# Patient Record
Sex: Male | Born: 1990 | Race: White | Hispanic: No | Marital: Single | Smoking: Never smoker
Health system: Southern US, Community
[De-identification: ages and names within clinical notes are randomized; demographics above are authoritative.]

## PROBLEM LIST (undated history)

## (undated) HISTORY — PX: OTHER SURGICAL HISTORY: SHX169

---

## 2016-02-27 ENCOUNTER — Other Ambulatory Visit: Payer: Self-pay

## 2016-02-27 ENCOUNTER — Ambulatory Visit
Admission: EM | Admit: 2016-02-27 | Discharge: 2016-02-27 | Disposition: A | Discharge status: Home / Self Care | Payer: 59 | Attending: Internal Medicine | Admitting: Internal Medicine

## 2016-02-27 DIAGNOSIS — K2970 Gastritis, unspecified, without bleeding: Secondary | ICD-10-CM | POA: Diagnosis not present

## 2016-02-27 DIAGNOSIS — R55 Syncope and collapse: Secondary | ICD-10-CM | POA: Insufficient documentation

## 2016-02-27 DIAGNOSIS — R109 Unspecified abdominal pain: Secondary | ICD-10-CM | POA: Diagnosis present

## 2016-02-27 MED ORDER — OMEPRAZOLE 20 MG PO CPDR: 40.0000 mg | DELAYED_RELEASE_CAPSULE | Freq: Two times a day (BID) | ORAL | Status: DC

## 2016-02-27 MED ORDER — FAMOTIDINE 40 MG PO TABS
40.0000 mg | ORAL_TABLET | Freq: Once | ORAL | Status: AC
  Administered 2016-02-27: 40 mg via ORAL

## 2016-02-27 MED ORDER — ONDANSETRON 8 MG PO TBDP
8.0000 mg | ORAL_TABLET | Freq: Once | ORAL | Status: AC
  Administered 2016-02-27: 8 mg via ORAL

## 2016-02-27 NOTE — Discharge Instructions (Signed)
ECG was unremarkable today; vitals signs obtained lying/sitting/standing do not suggest dehydration. Prescription for omeprazole 40mg  twice daily for 2 weeks was sent to the [facility] in [place]. Famotidine 40 mg 1-2 times daily or ranitidine 300 mg 1-2 times daily may also be helpful.   All of these medications are available over the counter. Stay hydrated.  Limit alcohol use.   Recheck if having frequent difficulty with vomiting, especially if you pass out again.  Dr Schuyler AmorWilliam Plonk at [facility] and Dr Everlene OtherJayce Cook at [facility] are both taking new patients.   Gastritis, Adult Gastritis is soreness and puffiness (inflammation) of the lining of the stomach. If you do not get help, gastritis can cause bleeding and sores (ulcers) in the stomach. HOME CARE   Only take medicine as told by your doctor.  If you were given antibiotic medicines, take them as told. Finish the medicines even if you start to feel better.  Drink enough fluids to keep your pee (urine) clear or pale yellow.  Avoid foods and drinks that make your problems worse. Foods you may want to avoid include:  Caffeine or alcohol.  Chocolate.  Mint.  Garlic and onions.  Spicy foods.  Citrus fruits, including oranges, lemons, or limes.  Food containing tomatoes, including sauce, chili, salsa, and pizza.  Fried and fatty foods.  Eat small meals throughout the day instead of large meals. GET HELP RIGHT AWAY IF:   You have black or dark red poop (stools).  You throw up (vomit) blood. It may look like coffee grounds.  You cannot keep fluids down.  Your belly (abdominal) pain gets worse.  You have a fever.  You do not feel better after 1 week.  You have any other questions or concerns. MAKE SURE YOU:   Understand these instructions.  Will watch your condition.  Will get help right away if you are not doing well or get worse.   This information is not intended to  replace advice given to you by your health care provider. Make sure you discuss any questions you have with your health care provider.   Document Released: 02/12/2008 Document Revised: 11/18/2011 Document Reviewed: 10/09/2011 Elsevier Interactive Patient Education [employer]2016 Elsevier Inc.

## 2016-02-27 NOTE — ED Notes (Addendum)
Patient was at work and felt dizzy and lightheaded. He started vomiting and then he passed out. He does have a history of intestinal problems. He does take medications for this issue however he doesn't know the names of them. 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 He also mentions that his spinal column hurts near the tail bone all the time.

## 2016-02-27 NOTE — ED Provider Notes (Addendum)
CSN: [id number]     Arrival date & time 02/27/16  [id number] History   First MD Initiated Contact with Patient 02/27/16 0840     Chief Complaint  Patient presents with  . Abdominal Pain   HPI  Patient is a 25 year old gentleman with history of intermittent upper GI difficulties for several years. He is not currently taking any medication for reflux. He moved here about 90 days ago from [place], and has had previous evaluations in [place] and in [place]. He felt fine yesterday, over slept a little today got up and went to work. He didn't feel that well work, had some stomach upset, was throwing up and then passed out. No injury, bumped his knee on the way down, but was able to walk into the urgent care independently. No further emesis. Not having diarrhea or change in bowel habits. No urinary symptoms. No fever. No emotional upset, feels happy despite recent move.  past medical history. Reflux, not currently taking medication.  Also reports that he presented with [facility] to [facility] and was told after the evaluation that the bottom wall of his heart was not squeezing normally. History reviewed. No pertinent past surgical history. No history of abdominal or any surgeries History reviewed. family history. No family history of stomach disorders, Crohn's disease, inflammatory bowel disease   Social History  Substance Use Topics  . Smoking status: Never Smoker   . Smokeless tobacco: Never Used  . 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   Allergies  Review of patient's allergies indicates no known allergies.  Home Medications  None  Meds Ordered and Administered this Visit   Medications  ondansetron (ZOFRAN-ODT) disintegrating tablet 8 mg (8 mg Oral Given 02/27/16 0908)  famotidine (PEPCID) tablet 40 mg (40 mg Oral Given 02/27/16 0907)    BP 144/82  mmHg  Pulse 56  Temp(Src) 97.8 F (36.6 C) (Oral)  Resp 16  Ht 6' (1.829 m)  Wt 165 lb (74.844 kg)  BMI 22.37 kg/m2  SpO2 100% Orthostatic VS for the past 24 hrs:  BP- Lying Pulse- Lying BP- Sitting Pulse- Sitting BP- Standing at 0 minutes Pulse- Standing at 0 minutes  02/27/16 0848 127/72 mmHg (!) 47 133/88 mmHg 52 129/81 mmHg 61    Physical Exam  Constitutional: He is oriented to person, place, and time. No distress.  Alert, nicely groomed  HENT:  Head: Atraumatic.  Eyes:  Conjugate gaze, no eye redness/drainage  Neck: Neck supple.  Cardiovascular: Normal rate and regular rhythm.   Pulmonary/Chest: No respiratory distress.  Lungs clear, symmetric breath sounds  Abdominal: Soft. He exhibits no distension. There is no tenderness. There is no rebound and no guarding.  Musculoskeletal: Normal range of motion. He exhibits no edema.  Neurological: He is alert and oriented to person, place, and time.  Skin: Skin is warm and dry. No rash noted. No pallor.  No cyanosis  Nursing note and vitals reviewed.   ED Course  Procedures (including critical care time)  ECG Done in the urgent care and reviewed by this provider demonstrates sinus bradycardia at a heart rate of 57 bpm; no acute ST or T-wave changes. PR interval 160 ms, QRS duration 82 ms, QTC 395 ms. QRS axis 76.  MDM   1. Gastritis   2. Vasovagal syncope  Meds ordered this encounter  Medications  . omeprazole (PRILOSEC) 20 MG capsule    Sig: Take 2 capsules (40 mg total) by mouth 2 (two) times daily before a meal.    Dispense:  60 capsule    Refill:  0   ECG was unremarkable today; vitals signs obtained lying/sitting/standing do not suggest dehydration. Prescription for omeprazole  twice daily for 2 weeks was sent to the [facility] in [place]. Famotidine 40 mg 1-2 times daily or ranitidine 300 mg 1-2 times daily may also be helpful.   All of these medications are available over the counter. Stay hydrated.  Limit  alcohol use.   Recheck if having frequent difficulty with vomiting, especially if you pass out again.  Dr Schuyler Amor at [facility] and Dr Everlene Other at [facility] are both taking new patients.     Eustace Moore, MD 02/27/16 1610  Eustace Moore, MD 02/27/16 [phone]

## 2016-02-29 ENCOUNTER — Ambulatory Visit
Admission: EM | Admit: 2016-02-29 | Discharge: 2016-02-29 | Disposition: A | Discharge status: Home / Self Care | Payer: 59 | Attending: Family Medicine | Admitting: Family Medicine

## 2016-02-29 ENCOUNTER — Encounter: Payer: Self-pay | Admitting: *Deleted

## 2016-02-29 DIAGNOSIS — S39012A Strain of muscle, fascia and tendon of lower back, initial encounter: Secondary | ICD-10-CM

## 2016-02-29 MED ORDER — TIZANIDINE HCL 6 MG PO CAPS: 6.0000 mg | ORAL_CAPSULE | Freq: Three times a day (TID) | ORAL | Status: DC

## 2016-02-29 MED ORDER — ONDANSETRON 8 MG PO TBDP: 8.0000 mg | ORAL_TABLET | Freq: Two times a day (BID) | ORAL | Status: DC

## 2016-02-29 NOTE — ED Notes (Signed)
Seen here on 6/20, same symptoms worse today. States nausea with any food, also dizziness, and low back pain.

## 2016-02-29 NOTE — Discharge Instructions (Signed)
Back Injury Prevention °Back injuries can be very painful. They can also be difficult to heal. After having one back injury, you are more likely to injure your back again. It is important to learn how to avoid injuring or re-injuring your back. The following tips can help you to prevent a back injury. °WHAT SHOULD I KNOW ABOUT PHYSICAL FITNESS? °· Exercise for 30 minutes per day on most days of the week or as directed by your health care provider. Make sure to: °· Do aerobic exercises, such as walking, jogging, biking, or swimming. °· Do exercises that increase balance and strength, such as tai chi and yoga. These can decrease your risk of falling and injuring your back. °· Do stretching exercises to help with flexibility. °· Try to develop strong abdominal muscles. Your abdominal muscles provide a lot of the support that is needed by your back. °· Maintain a healthy weight.  This helps to decrease your risk of a back injury. °WHAT SHOULD I KNOW ABOUT MY DIET? °· Talk with your health care provider about your overall diet. Take supplements and vitamins only as directed by your health care provider. °· Talk with your health care provider about how much calcium and vitamin D you need each day. These nutrients help to prevent weakening of the bones (osteoporosis). Osteoporosis can cause broken (fractured) bones, which lead to back pain. °· Include good sources of calcium in your diet, such as dairy products, green leafy vegetables, and products that have had calcium added to them (fortified). °· Include good sources of vitamin D in your diet, such as milk and foods that are fortified with vitamin D. °WHAT SHOULD I KNOW ABOUT MY POSTURE? °· Sit up straight and stand up straight. Avoid leaning forward when you sit or hunching over when you stand. °· Choose chairs that have good low-back (lumbar) support. °· If you work at a desk, sit close to it so you do not need to lean over. Keep your chin tucked in. Keep your neck  drawn back, and keep your elbows bent at a right angle. Your arms should look like the letter "L." °· Sit high and close to the steering wheel when you drive. Add a lumbar support to your car seat, if needed. °· Avoid sitting or standing in one position for very long. Take breaks to get up, stretch, and walk around at least one time every hour. Take breaks every hour if you are driving for long periods of time. °· Sleep on your side with your knees slightly bent, or sleep on your back with a pillow under your knees. Do not lie on the front of your body to sleep. °WHAT SHOULD I KNOW ABOUT LIFTING, TWISTING, AND REACHING? °Lifting and Heavy Lifting °· Avoid heavy lifting, especially repetitive heavy lifting. If you must do heavy lifting: °· Stretch before lifting. °· Work slowly. °· Rest between lifts. °· Use a tool such as a cart or a dolly to move objects if one is available. °· Make several small trips instead of carrying one heavy load. °· Ask for help when you need it, especially when moving big objects. °· Follow these steps when lifting: °· Stand with your feet shoulder-width apart. °· Get as close to the object as you can. Do not try to pick up a heavy object that is far from your body. °· Use handles or lifting straps if they are available. °· Bend at your knees. Squat down, but keep your heels off the floor. °·   Keep your shoulders pulled back, your chin tucked in, and your back straight. °· Lift the object slowly while you tighten the muscles in your legs, abdomen, and buttocks. Keep the object as close to the center of your body as possible. °· Follow these steps when putting down a heavy load: °· Stand with your feet shoulder-width apart. °· Lower the object slowly while you tighten the muscles in your legs, abdomen, and buttocks. Keep the object as close to the center of your body as possible. °· Keep your shoulders pulled back, your chin tucked in, and your back straight. °· Bend at your knees. Squat  down, but keep your heels off the floor. °· Use handles or lifting straps if they are available. °Twisting and Reaching °· Avoid lifting heavy objects above your waist. °· Do not twist at your waist while you are lifting or carrying a load. If you need to turn, move your feet. °· Do not bend over without bending at your knees. °· Avoid reaching over your head, across a table, or for an object on a high surface. °WHAT ARE SOME OTHER TIPS? °· Avoid wet floors and icy ground. Keep sidewalks clear of ice to prevent falls. °· Do not sleep on a mattress that is too soft or too hard. °· Keep items that are used frequently within easy reach. °· Put heavier objects on shelves at waist level, and put lighter objects on lower or higher shelves. °· Find ways to decrease your stress, such as exercise, massage, or relaxation techniques. Stress can build up in your muscles. Tense muscles are more vulnerable to injury. °· Talk with your health care provider if you feel anxious or depressed. These conditions can make back pain worse. °· Wear flat heel shoes with cushioned soles. °· Avoid sudden movements. °· Use both shoulder straps when carrying a backpack. °· Do not use any tobacco products, including cigarettes, chewing tobacco, or electronic cigarettes. If you need help quitting, ask your health care provider. °  °This information is not intended to replace advice given to you by your health care provider. Make sure you discuss any questions you have with your health care provider. °  °Document Released: 10/03/2004 Document Revised: 01/10/2015 Document Reviewed: 08/30/2014 °Elsevier Interactive Patient Education ©2016 Elsevier Inc. ° °Lumbosacral Strain °Lumbosacral strain is a strain of any of the parts that make up your lumbosacral vertebrae. Your lumbosacral vertebrae are the bones that make up the lower third of your backbone. Your lumbosacral vertebrae are held together by muscles and tough, fibrous tissue (ligaments).    °CAUSES  °A sudden blow to your back can cause lumbosacral strain. Also, anything that causes an excessive stretch of the muscles in the low back can cause this strain. This is typically seen when people exert themselves strenuously, fall, lift heavy objects, bend, or crouch repeatedly. °RISK FACTORS °· Physically demanding work. °· Participation in pushing or pulling sports or sports that require a sudden twist of the back (tennis, golf, baseball). °· Weight lifting. °· Excessive lower back curvature. °· Forward-tilted pelvis. °· Weak back or abdominal muscles or both. °· Tight hamstrings. °SIGNS AND SYMPTOMS  °Lumbosacral strain may cause pain in the area of your injury or pain that moves (radiates) down your leg.  °DIAGNOSIS °Your health care provider can often diagnose lumbosacral strain through a physical exam. In some cases, you may need tests such as X-ray exams.  °TREATMENT  °Treatment for your lower back injury depends on many factors that   your clinician will have to evaluate. However, most treatment will include the use of anti-inflammatory medicines. °HOME CARE INSTRUCTIONS  °· Avoid hard physical activities (tennis, racquetball, waterskiing) if you are not in proper physical condition for it. This may aggravate or create problems. °· If you have a back problem, avoid sports requiring sudden body movements. Swimming and walking are generally safer activities. °· Maintain good posture. °· Maintain a healthy weight. °· For acute conditions, you may put ice on the injured area. °¨ Put ice in a plastic bag. °¨ Place a towel between your skin and the bag. °¨ Leave the ice on for 20 minutes, 2-3 times a day. °· When the low back starts healing, stretching and strengthening exercises may be recommended. °SEEK MEDICAL CARE IF: °· Your back pain is getting worse. °· You experience severe back pain not relieved with medicines. °SEEK IMMEDIATE MEDICAL CARE IF:  °· You have numbness, tingling, weakness, or problems  with the use of your arms or legs. °· There is a change in bowel or bladder control. °· You have increasing pain in any area of the body, including your belly (abdomen). °· You notice shortness of breath, dizziness, or feel faint. °· You feel sick to your stomach (nauseous), are throwing up (vomiting), or become sweaty. °· You notice discoloration of your toes or legs, or your feet get very cold. °MAKE SURE YOU:  °· Understand these instructions. °· Will watch your condition. °· Will get help right away if you are not doing well or get worse. °  °This information is not intended to replace advice given to you by your health care provider. Make sure you discuss any questions you have with your health care provider. °  °Document Released: 06/05/2005 Document Revised: 09/16/2014 Document Reviewed: 04/14/2013 °Elsevier Interactive Patient Education ©2016 Elsevier Inc. ° °

## 2016-02-29 NOTE — ED Provider Notes (Signed)
CSN: [id number]     Arrival date & time 02/29/16  1041 History   First MD Initiated Contact with Patient 02/29/16 1132     Chief Complaint  Patient presents with  . Dizziness  . Nausea  . Emesis  . Back Pain   (Consider location/radiation/quality/duration/timing/severity/associated sxs/prior Treatment) HPI this is a [age]-year-old male who returns after being seen 2 days ago by Dr. Dayton ScrapeMurray diagnosed him with a gastritis and a vasovagal reaction. Prescribed omeprazole 40 mg twice a day which he states he has been taken but despite that has continued to have nausea when he eats any food dizziness which seems worse when he presented 2 days ago. In addition he states that he has been having low back pain is nonradiating, mostly on the left side just to the spine. This is going on for months according to the patient he noticed when he [place] [facility] [place]. He also has belly pain which is periumbilical which has been present for some time as well. Had no vomiting. He states that he is able to eat and drink his chest that he has the belly pain 15-30 minutes after eating. He has had no fever or chills. He denies diarrhea or constipation and had normal bowel movement this morning.    History reviewed. No pertinent past medical history. History reviewed. No pertinent past surgical history. History reviewed. No pertinent family history. Social History  Substance Use Topics  . Smoking status: Never Smoker   . Smokeless tobacco: Never Used  . Alcohol Use: 0.6 oz/week    1 Cans of beer per week    Review of Systems  Constitutional: Positive for activity change. Negative for fever, chills and fatigue.  Gastrointestinal: Positive for nausea and abdominal pain. Negative for vomiting, diarrhea and constipation.  All other systems reviewed and are negative.   Allergies  Review of patient's allergies indicates no known allergies.  Home Medications   Prior to Admission medications    Medication Sig Start Date End Date Taking? Authorizing Provider  omeprazole (PRILOSEC) 20 MG capsule Take 2 capsules (40 mg total) by mouth 2 (two) times daily before a meal. 02/27/16 03/12/16 Yes Eustace MooreLaura W Murray, MD  ondansetron (ZOFRAN ODT) 8 MG disintegrating tablet Take 1 tablet (8 mg total) by mouth 2 (two) times daily. 02/29/16   Lutricia FeilWilliam P Roemer, PA-C  tizanidine (ZANAFLEX) 6 MG capsule Take 1 capsule (6 mg total) by mouth 3 (three) times daily. 02/29/16   Lutricia FeilWilliam P Roemer, PA-C   Meds Ordered and Administered this Visit  Medications - No data to display  BP 113/63 mmHg  Pulse 77  Temp(Src) 98.3 F (36.8 C) (Oral)  Resp 16  Ht 6' (1.829 m)  Wt 165 lb (74.844 kg)  BMI 22.37 kg/m2  SpO2 100% No data found.   Physical Exam  Constitutional: He is oriented to person, place, and time. He appears well-developed and well-nourished. No distress.  HENT:  Head: Normocephalic and atraumatic.  Right Ear: External ear normal.  Left Ear: External ear normal.  Nose: Nose normal.  Mouth/Throat: Oropharynx is clear and moist. No oropharyngeal exudate.  Eyes: Conjunctivae are normal. Pupils are equal, round, and reactive to light.  Neck: Normal range of motion. Neck supple.  Pulmonary/Chest: Effort normal and breath sounds normal. No respiratory distress. He has no wheezes. He has no rales.  Abdominal: Soft. Bowel sounds are normal. He exhibits no distension and no mass. There is tenderness. There is no rebound and  no guarding.  There is tenderness to to palpation diffusely of the lower quadrants bilaterally. There is no rebound no guarding no masses palpable. The patient does state that with palpation of the abdomen he does feel the pain in his back.  Musculoskeletal: He exhibits tenderness.  Examination of his lumbar spine shows some palpable spasm in the lower segments more left than right. He does have some mild limitation of motion for flexion and lateral flexion to the left.   Lymphadenopathy:    He has no cervical adenopathy.  Neurological: He is alert and oriented to person, place, and time.  Skin: Skin is warm and dry. He is not diaphoretic.  Psychiatric: He has a normal mood and affect. His behavior is normal. Judgment and thought content normal.  Nursing note and vitals reviewed.   ED Course  Procedures (including critical care time)  Labs Review Labs Reviewed - No data to display  Imaging Review No results found.   Visual Acuity Review  Right Eye Distance:   Left Eye Distance:   Bilateral Distance:    Right Eye Near:   Left Eye Near:    Bilateral Near:         MDM   1. Lumbar strain, initial encounter    Discharge Medication List as of 02/29/2016 12:22 PM    START taking these medications   Details  ondansetron (ZOFRAN ODT) 8 MG disintegrating tablet Take 1 tablet (8 mg total) by mouth 2 (two) times daily., Starting 02/29/2016, Until Discontinued, Normal    tizanidine (ZANAFLEX) 6 MG capsule Take 1 capsule (6 mg total) by mouth 3 (three) times daily., Starting 02/29/2016, Until Discontinued, Normal      Plan: 1. Test/x-ray results and diagnosis reviewed with patient 2. rx as per orders; risks, benefits, potential side effects reviewed with patient 3. Recommend supportive treatment with Symptom avoidance and rest as necessary for his low back pain. I have recommended that he hydrate so that his urine is clear. I've reiterated with Dr. Dayton ScrapeMurray had told the patient about the necessity for follow-up with her primary care physician for further workup. I have given him the name of Dr. Adriana Simasook who perform a more thorough investigation and may even consider a GI consult. If he is not improving or is worsening he should go to emergency department we have encouraged him to continue taking the Prilosec as prescribed. I have cautioned him regarding use of the muscle relaxants with activities require concentration and judgment and not use them while  driving. 4. F/u prn if symptoms worsen or don't improve     Lutricia FeilWilliam P Roemer, PA-C 02/29/16 1325

## 2016-03-27 ENCOUNTER — Encounter: Payer: Self-pay | Admitting: Emergency Medicine

## 2016-03-27 ENCOUNTER — Ambulatory Visit
Admission: EM | Admit: 2016-03-27 | Discharge: 2016-03-27 | Disposition: A | Discharge status: Home / Self Care | Payer: 59 | Attending: Family Medicine | Admitting: Family Medicine

## 2016-03-27 DIAGNOSIS — R0789 Other chest pain: Secondary | ICD-10-CM | POA: Diagnosis not present

## 2016-03-27 DIAGNOSIS — R079 Chest pain, unspecified: Secondary | ICD-10-CM | POA: Diagnosis present

## 2016-03-27 NOTE — ED Provider Notes (Signed)
CSN: [id number]     Arrival date & time 03/27/16  1307 History   First MD Initiated Contact with Patient 03/27/16 1322     Chief Complaint  Patient presents with  . Chest Pain   (Consider location/radiation/quality/duration/timing/severity/associated sxs/prior Treatment) HPI: The patient presents today with history of tightness in his chest with possibly increased heart rate intermittently over the last 2 weeks. Patient states that earlier today he was about to eat when he started to have an episode where he felt his heart rate was up and had some tightness in his chest that then caused him to have left neck numbness going into his shoulder but not down his arm. He states that this lasted only a few minutes and then resolved he does not have these symptoms now. He states that his heart feels back to normal now. He denies any shortness of breath, nausea, vomiting, headache, dizziness, fever, weakness. He denies any caffeine use today. He does not drink caffeine often. He denies any alcohol use recently, no smoking or illicit drug use like cocaine or heroin ever. He denies any marijuana use recently. He denies any family history of sudden cardiac death in the past. He denies having a syncopal episode ever. He does admit to having been told in the past that he had a "valve issue that caused heart not to pump right " but never followed up as asked.  History reviewed. No pertinent past medical history. History reviewed. No pertinent past surgical history. History reviewed. No pertinent family history. Social History  Substance Use Topics  . Smoking status: Never Smoker   . Smokeless tobacco: Never Used  . Alcohol Use: 0.6 oz/week    1 Cans of beer per week    Review of Systems: Negative except mentioned above.   Allergies  Review of patient's allergies indicates no known allergies.  Home Medications   Prior to Admission medications   Medication Sig Start Date End Date Taking? Authorizing  Provider  omeprazole (PRILOSEC) 20 MG capsule Take 2 capsules (40 mg total) by mouth 2 (two) times daily before a meal. 02/27/16 03/12/16  Eustace Moore, MD  ondansetron (ZOFRAN ODT) 8 MG disintegrating tablet Take 1 tablet (8 mg total) by mouth 2 (two) times daily. 02/29/16   Lutricia Feil, PA-C  tizanidine (ZANAFLEX) 6 MG capsule Take 1 capsule (6 mg total) by mouth 3 (three) times daily. 02/29/16   Lutricia Feil, PA-C   Meds Ordered and Administered this Visit  Medications - No data to display  BP 116/68 mmHg  Pulse 83  Temp(Src) 98.1 F (36.7 C) (Tympanic)  Resp 16  Ht 6' (1.829 m)  Wt 175 lb (79.379 kg)  BMI 23.73 kg/m2  SpO2 99% No data found.   Physical Exam:   GENERAL: NAD HEENT: no pharyngeal erythema, no exudate RESP: CTA B CARD: RRR EXTREM: FROM, 5/5 strength, -Homans  NEURO: CN II-XII grossly intact, numbness has resolved   ED Course  Procedures (including critical care time)  Labs Review Labs Reviewed - No data to display  Imaging Review No results found.  EKG shows normal sinus rhythm with no ST elevation or depression, p waves present    MDM  A/P: Chest tightness - unsure of etiology possibly related to SVT, all symptoms resolved at this time, patient denies any panic or anxiety issues, I would recommend outpatient follow-up with cardiology as soon as possible, if patient has any other problems prior to his visit with the cardiologist  and would recommend that he go to the ER. I have reviewed with him ways to check his heart rate and reviewed Valsalva maneuver if his heart rate is above 200. I've advised him not to do any significant exercise until he is seen by cardiology. Patient addresses understanding.    Jolene ProvostKirtida Shadawn Hanaway, MD 03/27/16 1349

## 2016-03-27 NOTE — ED Notes (Signed)
Patient c/o started having tightness in chest that started two weeks ago.  Patient reports numbness on the left side of his face down to his left shoulder within the past few mins.  Patient denies N/V.  Patient denies SOB.

## 2016-04-01 ENCOUNTER — Ambulatory Visit: Payer: 59 | Admitting: Family Medicine

## 2016-04-12 ENCOUNTER — Ambulatory Visit: Payer: 59 | Admitting: Family Medicine

## 2016-04-12 DIAGNOSIS — Z0289 Encounter for other administrative examinations: Secondary | ICD-10-CM

## 2016-04-18 ENCOUNTER — Ambulatory Visit: Payer: 59 | Admitting: Cardiology

## 2016-07-29 ENCOUNTER — Encounter: Payer: Self-pay | Admitting: Emergency Medicine

## 2016-07-29 ENCOUNTER — Emergency Department: Payer: 59

## 2016-07-29 ENCOUNTER — Emergency Department
Admission: EM | Admit: 2016-07-29 | Discharge: 2016-07-29 | Disposition: A | Discharge status: Home / Self Care | Payer: 59 | Attending: Emergency Medicine | Admitting: Emergency Medicine

## 2016-07-29 DIAGNOSIS — R079 Chest pain, unspecified: Secondary | ICD-10-CM | POA: Insufficient documentation

## 2016-07-29 LAB — BASIC METABOLIC PANEL
Anion gap: 8 (ref 5–15)
BUN: 22 mg/dL — AB (ref 6–20)
CO2: 24 mmol/L (ref 22–32)
CREATININE: 0.82 mg/dL (ref 0.61–1.24)
Calcium: 9.1 mg/dL (ref 8.9–10.3)
Chloride: 107 mmol/L (ref 101–111)
GFR calc Af Amer: >60 mL/min (ref >60)
Glucose, Bld: 104 mg/dL — ABNORMAL HIGH (ref 65–99)
Potassium: 3.7 mmol/L (ref 3.5–5.1)
SODIUM: 139 mmol/L (ref 135–145)

## 2016-07-29 LAB — CBC
HCT: 41.3 % (ref 40.0–52.0)
Hemoglobin: 14 g/dL (ref 13.0–18.0)
MCH: 28 pg (ref 26.0–34.0)
MCHC: 33.9 g/dL (ref 32.0–36.0)
MCV: 82.5 fL (ref 80.0–100.0)
PLATELETS: 221 10*3/uL (ref 150–440)
RBC: 5.01 MIL/uL (ref 4.40–5.90)
RDW: 13.3 % (ref 11.5–14.5)
WBC: 7.6 10*3/uL (ref 3.8–10.6)

## 2016-07-29 LAB — TROPONIN I: Troponin I: <0.03 ng/mL (ref <0.03)

## 2016-07-29 NOTE — ED Notes (Signed)
Discharge instructions reviewed with patient. Questions fielded by this RN. Patient verbalizes understanding of instructions. Patient discharged home in stable condition per paduchowski md . No acute distress noted at time of discharge.

## 2016-07-29 NOTE — ED Triage Notes (Addendum)
Patient ambulatory to triage with steady gait, without difficulty or distress noted; pt reports left sided CP this evening after drinking a red bull with no accomp symptoms; denies radiating pain

## 2016-07-29 NOTE — Discharge Instructions (Signed)
You have been seen in the emergency department today for chest pain. Your workup has shown normal results. As we discussed please follow-up with your primary care physician in the next 1-2 days for recheck. Return to the emergency department for any further chest pain, trouble breathing, or any other symptom personally concerning to yourself. Please follow-up with your cardiologist on Wednesday as scheduled.

## 2016-07-29 NOTE — ED Provider Notes (Signed)
[facility] Emergency Department Provider Note  Time seen: 10:27 PM  I have reviewed the triage vital signs and the nursing notes.   HISTORY  Chief Complaint Chest Pain    HPI Timothy Baird is a 25 y.o. male with no past medical history presents to the emergency department with chest pain. According to the patient he was at work tonight when he began experiencing central chest pain. States a mild cough as well. Denies any fever. Denies any trouble breathing, nausea or diaphoresis. Denies any lower extremity edema or swelling. Patient states he has been having ongoing chest pain for the past 6 or 7 years. He has been seen by cardiologist in [place], but not [place]. Patient states he actually has an appointment to see a cardiologist on Wednesday of this week for further evaluation.Patient states the chest pain was moderate however it is gone now.  History reviewed. No pertinent past medical history.  There are no active problems to display for this patient.   History reviewed. No pertinent surgical history.  Prior to Admission medications   Medication Sig Start Date End Date Taking? Authorizing Provider  omeprazole (PRILOSEC) 20 MG capsule Take 2 capsules (40 mg total) by mouth 2 (two) times daily before a meal. 02/27/16 03/12/16  Eustace MooreLaura W Murray, MD  ondansetron (ZOFRAN ODT) 8 MG disintegrating tablet Take 1 tablet (8 mg total) by mouth 2 (two) times daily. 02/29/16   Lutricia FeilWilliam P Roemer, PA-C  tizanidine (ZANAFLEX) 6 MG capsule Take 1 capsule (6 mg total) by mouth 3 (three) times daily. 02/29/16   Lutricia FeilWilliam P Roemer, PA-C    No Known Allergies  No family history on file.  Social History Social History  Substance Use Topics  . Smoking status: Never Smoker  . Smokeless tobacco: Never Used  . Alcohol use 0.6 oz/week    1 Cans of beer per week    Review of Systems Constitutional: Negative for fever. Cardiovascular:Positive for chest pain,  now resolved. Respiratory: Negative for shortness of breath. Gastrointestinal: Negative for abdominal pain Musculoskeletal: Negative for back pain. Neurological: Negative for headache 10-point ROS otherwise negative.  ____________________________________________   PHYSICAL EXAM:  VITAL SIGNS: ED Triage Vitals  Enc Vitals Group     BP 07/29/16 2117 115/65     Pulse Rate 07/29/16 2117 75     Resp 07/29/16 2117 18     Temp 07/29/16 2117 98.3 F (36.8 C)     Temp Source 07/29/16 2117 Oral     SpO2 07/29/16 2117 100 %     Weight 07/29/16 2106 165 lb (74.8 kg)     Height 07/29/16 2106 6' (1.829 m)     Head Circumference --      Peak Flow --      Pain Score 07/29/16 2105 7     Pain Loc --      Pain Edu? --      Excl. in GC? --     Constitutional: Alert and oriented. Well appearing and in no distress. Eyes: Normal exam ENT   Head: Normocephalic and atraumatic.   Mouth/Throat: Mucous membranes are moist. Cardiovascular: Normal rate, regular rhythm. No murmurs, rubs, or gallops. Respiratory: Normal respiratory effort without tachypnea nor retractions. Breath sounds are clear  Gastrointestinal: Soft and nontender. No distention.  Musculoskeletal: Nontender with normal range of motion in all extremities. No lower extremity tenderness or edema. Neurologic:  Normal speech and language. No gross focal neurologic deficits  Skin:  Skin is warm, dry and intact.  Psychiatric: Mood and affect are normal. Speech and behavior are normal.   ____________________________________________    EKG  EKG reviewed and interpreted by myself shows sinus bradycardia at 59 bpm, narrow QRS, normal axis, normal intervals, no ST changes. Normal EKG.  ____________________________________________    RADIOLOGY  Chest x-ray negative  ____________________________________________   INITIAL IMPRESSION / ASSESSMENT AND PLAN / ED COURSE  Pertinent labs & imaging results that were available  during my care of the patient were reviewed by me and considered in my medical decision making (see chart for details).  Patient presents the emergency department with chest pain which began while at work roughly 3-4 hours ago, now resolved. Denies any shortness breath, nausea or diaphoresis. Patient has a normal physical exam. Negative labs including negative troponin. Reassuring EKG. And a normal chest x-ray. Patient has cardiology follow-up on Wednesday. We'll discharge the patient home at this time with cardiology follow-up as scheduled.  ____________________________________________   FINAL CLINICAL IMPRESSION(S) / ED DIAGNOSES  Chest pain    Minna AntisKevin Lejuan Botto, MD 07/29/16 2229

## 2016-08-06 ENCOUNTER — Encounter: Payer: Self-pay | Admitting: Emergency Medicine

## 2016-08-06 ENCOUNTER — Emergency Department
Admission: EM | Admit: 2016-08-06 | Discharge: 2016-08-06 | Disposition: A | Discharge status: Home / Self Care | Payer: Worker's Compensation | Attending: Emergency Medicine | Admitting: Emergency Medicine

## 2016-08-06 ENCOUNTER — Emergency Department: Payer: Worker's Compensation

## 2016-08-06 DIAGNOSIS — Y9389 Activity, other specified: Secondary | ICD-10-CM | POA: Diagnosis not present

## 2016-08-06 DIAGNOSIS — Z79899 Other long term (current) drug therapy: Secondary | ICD-10-CM | POA: Insufficient documentation

## 2016-08-06 DIAGNOSIS — S83421A Sprain of lateral collateral ligament of right knee, initial encounter: Secondary | ICD-10-CM | POA: Insufficient documentation

## 2016-08-06 DIAGNOSIS — Y990 Civilian activity done for income or pay: Secondary | ICD-10-CM | POA: Diagnosis not present

## 2016-08-06 DIAGNOSIS — Y929 Unspecified place or not applicable: Secondary | ICD-10-CM | POA: Diagnosis not present

## 2016-08-06 DIAGNOSIS — S8991XA Unspecified injury of right lower leg, initial encounter: Secondary | ICD-10-CM | POA: Diagnosis present

## 2016-08-06 DIAGNOSIS — W1839XA Other fall on same level, initial encounter: Secondary | ICD-10-CM | POA: Insufficient documentation

## 2016-08-06 MED ORDER — MELOXICAM 15 MG PO TABS: 15.0000 mg | ORAL_TABLET | Freq: Every day | ORAL | 0 refills | Status: DC

## 2016-08-06 NOTE — ED Provider Notes (Signed)
[facility] Emergency Department Provider Note  ____________________________________________  Time seen: Approximately 10:53 PM  I have reviewed the triage vital signs and the nursing notes.   HISTORY  Chief Complaint Knee Injury    HPI Timothy Baird is a 25 y.o. male who presents emergency department complaining of lateral right knee pain. Patient states that he was at work when he was standing on a pallet and his foot went through a hole. Patient states that he twisted and fell backwards. Patient is having pain to the lateral aspect of the knee. He denies any deformity. He denies any previous history of injury or surgery to that knee. No medications prior to arrival. Pain is sharp, constant, worse with movement or weightbearing.   History reviewed. No pertinent past medical history.  There are no active problems to display for this patient.   History reviewed. No pertinent surgical history.  Prior to Admission medications   Medication Sig Start Date End Date Taking? Authorizing Provider  meloxicam (MOBIC) 15 MG tablet Take 1 tablet (15 mg total) by mouth daily. 08/06/16   Delorise RoyalsJonathan D Dontasia Miranda, PA-C  omeprazole (PRILOSEC) 20 MG capsule Take 2 capsules (40 mg total) by mouth 2 (two) times daily before a meal. 02/27/16 03/12/16  Eustace MooreLaura W Murray, MD  ondansetron (ZOFRAN ODT) 8 MG disintegrating tablet Take 1 tablet (8 mg total) by mouth 2 (two) times daily. 02/29/16   Lutricia FeilWilliam P Roemer, PA-C  tizanidine (ZANAFLEX) 6 MG capsule Take 1 capsule (6 mg total) by mouth 3 (three) times daily. 02/29/16   Lutricia FeilWilliam P Roemer, PA-C    Allergies Patient has no known allergies.  No family history on file.  Social History Social History  Substance Use Topics  . Smoking status: Never Smoker  . Smokeless tobacco: Never Used  . Alcohol use 0.6 oz/week    1 Cans of beer per week     Review of Systems  Constitutional: No fever/chills Cardiovascular: no chest  pain. Respiratory: no cough. No SOB. Musculoskeletal: Positive for right knee pain Skin: Negative for rash, abrasions, lacerations, ecchymosis. Neurological: Negative for headaches, focal weakness or numbness. 10-point ROS otherwise negative.  ____________________________________________   PHYSICAL EXAM:  VITAL SIGNS: ED Triage Vitals  Enc Vitals Group     BP 08/06/16 2158 (!) 119/50     Pulse Rate 08/06/16 2158 77     Resp 08/06/16 2158 18     Temp 08/06/16 2158 98 F (36.7 C)     Temp Source 08/06/16 2158 Oral     SpO2 08/06/16 2158 98 %     Weight 08/06/16 2159 165 lb (74.8 kg)     Height 08/06/16 2159 6' (1.829 m)     Head Circumference --      Peak Flow --      Pain Score 08/06/16 2159 8     Pain Loc --      Pain Edu? --      Excl. in GC? --      Constitutional: Alert and oriented. Well appearing and in no acute distress. Eyes: Conjunctivae are normal. PERRL. EOMI. Head: Atraumatic. Neck: No stridor.    Cardiovascular: Normal rate, regular rhythm. Normal S1 and S2.  Good peripheral circulation. Respiratory: Normal respiratory effort without tachypnea or retractions. Lungs CTAB. Good air entry to the bases with no decreased or absent breath sounds. Musculoskeletal: Full range of motion to all extremities. No gross deformities appreciated.No deformities, edema, ecchymosis noted to the right knee. Full range of  motion with encouragement. Patient is tender to palpation along the LCL. No palpable abnormality. There is, valgus, Lachman's, McMurray's is negative. Dorsalis pedis pulse intact distally. Sensation intact distally. Neurologic:  Normal speech and language. No gross focal neurologic deficits are appreciated.  Skin:  Skin is warm, dry and intact. No rash noted. Psychiatric: Mood and affect are normal. Speech and behavior are normal. Patient exhibits appropriate insight and judgement.   ____________________________________________   LABS (all labs ordered are  listed, but only abnormal results are displayed)  Labs Reviewed - No data to display ____________________________________________  EKG   ____________________________________________  RADIOLOGY Festus BarrenI, Lillieann Pavlich D Bryony Kaman, personally viewed and evaluated these images (plain radiographs) as part of my medical decision making, as well as reviewing the written report by the radiologist.  Dg Knee Complete 4 Views Right  Result Date: 08/06/2016 CLINICAL DATA:  Stepped off great, complains of pain in the right knee EXAM: RIGHT KNEE - COMPLETE 4+ VIEW COMPARISON:  None. FINDINGS: No evidence of fracture, dislocation, or joint effusion. No evidence of arthropathy or other focal bone abnormality. Trace suprapatellar effusion. IMPRESSION: No acute osseous abnormality Electronically Signed   By: Jasmine PangKim  Fujinaga M.D.   On: 08/06/2016 22:28    ____________________________________________    PROCEDURES  Procedure(s) performed:    Procedures    Medications - No data to display   ____________________________________________   INITIAL IMPRESSION / ASSESSMENT AND PLAN / ED COURSE  Pertinent labs & imaging results that were available during my care of the patient were reviewed by me and considered in my medical decision making (see chart for details).  Review of the [place] CSRS was performed in accordance of the [facility] prior to dispensing any controlled drugs.  Clinical Course     Patient's diagnosis is consistent with Lateral collateral ligament sprain. X-ray reveals no acute osseous abnormality. Exam is reassuring. Patient will be discharged home with prescriptions for anti-inflammatories for symptom control. Patient is to follow up with orthopedics as needed or otherwise directed. Patient is given ED precautions to return to the ED for any worsening or new symptoms.     ____________________________________________  FINAL CLINICAL IMPRESSION(S) / ED DIAGNOSES  Final diagnoses:  Sprain of  lateral collateral ligament of right knee, initial encounter      NEW MEDICATIONS STARTED DURING THIS VISIT:  New Prescriptions   MELOXICAM (MOBIC) 15 MG TABLET    Take 1 tablet (15 mg total) by mouth daily.        This chart was dictated using voice recognition software/Dragon. Despite best efforts to proofread, errors can occur which can change the meaning. Any change was purely unintentional.    Racheal PatchesJonathan D Jubal Rademaker, PA-C 08/06/16 2255    Phineas SemenGraydon Goodman, MD 08/06/16 2256

## 2016-08-06 NOTE — ED Triage Notes (Addendum)
Pt to triage via w/c with no distress noted; st stepped off crate, unloading pallet and fell; c/o pain to right knee after leg "got stuck"; pt employeed with [employer] and accomp by supervisor Earleen Reaper(Joshua Garner) who requested UDS; pt reports in triage unable to give urine specimen at this time

## 2016-08-06 NOTE — ED Notes (Signed)
Pt in x-ray. Supervisor at bedside.

## 2016-11-03 ENCOUNTER — Encounter: Payer: Self-pay | Admitting: Emergency Medicine

## 2016-11-03 ENCOUNTER — Emergency Department
Admission: EM | Admit: 2016-11-03 | Discharge: 2016-11-03 | Disposition: A | Discharge status: Home / Self Care | Payer: 59 | Attending: Emergency Medicine | Admitting: Emergency Medicine

## 2016-11-03 ENCOUNTER — Emergency Department: Payer: 59

## 2016-11-03 DIAGNOSIS — R0789 Other chest pain: Secondary | ICD-10-CM

## 2016-11-03 DIAGNOSIS — R072 Precordial pain: Secondary | ICD-10-CM | POA: Insufficient documentation

## 2016-11-03 DIAGNOSIS — Z79899 Other long term (current) drug therapy: Secondary | ICD-10-CM | POA: Insufficient documentation

## 2016-11-03 LAB — CBC
HEMATOCRIT: 39.8 % — AB (ref 40.0–52.0)
HEMOGLOBIN: 13.4 g/dL (ref 13.0–18.0)
MCH: 27.8 pg (ref 26.0–34.0)
MCHC: 33.6 g/dL (ref 32.0–36.0)
MCV: 82.5 fL (ref 80.0–100.0)
Platelets: 218 10*3/uL (ref 150–440)
RBC: 4.82 MIL/uL (ref 4.40–5.90)
RDW: 14.1 % (ref 11.5–14.5)
WBC: 9.7 10*3/uL (ref 3.8–10.6)

## 2016-11-03 LAB — BASIC METABOLIC PANEL
ANION GAP: 8 (ref 5–15)
BUN: 17 mg/dL (ref 6–20)
CO2: 27 mmol/L (ref 22–32)
Calcium: 9.1 mg/dL (ref 8.9–10.3)
Chloride: 103 mmol/L (ref 101–111)
Creatinine, Ser: 1.22 mg/dL (ref 0.61–1.24)
GFR calc Af Amer: >60 mL/min (ref >60)
GLUCOSE: 126 mg/dL — AB (ref 65–99)
POTASSIUM: 3.9 mmol/L (ref 3.5–5.1)
Sodium: 138 mmol/L (ref 135–145)

## 2016-11-03 LAB — TROPONIN I: Troponin I: <0.03 ng/mL (ref <0.03)

## 2016-11-03 LAB — HEPATIC FUNCTION PANEL
ALBUMIN: 4.3 g/dL (ref 3.5–5.0)
ALT: 20 U/L (ref 17–63)
AST: 22 U/L (ref 15–41)
Alkaline Phosphatase: 60 U/L (ref 38–126)
Total Bilirubin: 0.4 mg/dL (ref 0.3–1.2)
Total Protein: 7.2 g/dL (ref 6.5–8.1)

## 2016-11-03 LAB — LIPASE, BLOOD: Lipase: 20 U/L (ref 11–51)

## 2016-11-03 LAB — FIBRIN DERIVATIVES D-DIMER (ARMC ONLY): Fibrin derivatives D-dimer (ARMC): 407 (ref 0–499)

## 2016-11-03 MED ORDER — PANTOPRAZOLE SODIUM 20 MG PO TBEC: 20.0000 mg | DELAYED_RELEASE_TABLET | Freq: Every day | ORAL | 1 refills | Status: DC

## 2016-11-03 MED ORDER — HYDROMORPHONE HCL 1 MG/ML IJ SOLN
1.0000 mg | Freq: Once | INTRAMUSCULAR | Status: AC
  Administered 2016-11-03: 1 mg via INTRAVENOUS

## 2016-11-03 MED ORDER — HYDROMORPHONE HCL 1 MG/ML IJ SOLN
INTRAMUSCULAR | Status: AC
  Administered 2016-11-03: 1 mg via INTRAVENOUS
  Filled 2016-11-03: qty 1

## 2016-11-03 MED ORDER — ONDANSETRON HCL 4 MG/2ML IJ SOLN
4.0000 mg | Freq: Once | INTRAMUSCULAR | Status: AC
  Administered 2016-11-03: 4 mg via INTRAVENOUS

## 2016-11-03 MED ORDER — TRAMADOL HCL 50 MG PO TABS: 50.0000 mg | ORAL_TABLET | Freq: Four times a day (QID) | ORAL | 0 refills | Status: DC | PRN

## 2016-11-03 MED ORDER — ONDANSETRON HCL 4 MG/2ML IJ SOLN
INTRAMUSCULAR | Status: AC
  Administered 2016-11-03: 4 mg via INTRAVENOUS
  Filled 2016-11-03: qty 2

## 2016-11-03 NOTE — ED Triage Notes (Addendum)
Mid sternal chest pain started yesterday and radiates to back and ribs. Also reports SHOB that also started yesterday. Denies injury. Denies recent cold symptoms.

## 2016-11-03 NOTE — ED Provider Notes (Signed)
Time Seen: Approximately 1827  I have reviewed the triage notes  Chief Complaint: Chest Pain   History of Present Illness: Timothy Baird is a 26 y.o. male who presents with midsternal chest discomfort into both of his ribs and points primarily to the epigastric area. He denies any radiation of the pain to the back or lower abdominal area. He denies any blood in his stool. He denies any nausea, vomiting. He denies any fever or chills or productive cough. He states that periodically hurts when he takes a deep breath and points again to the lower bilateral rib cage region. He denies any arm or jaw pain. Patient states his pain started last evening and has continued throughout today. No obvious exacerbating or relieving factors  History reviewed. No pertinent past medical history.  There are no active problems to display for this patient.   Past Surgical History:  Procedure Laterality Date  . left leg surgery      Past Surgical History:  Procedure Laterality Date  . left leg surgery      Current Outpatient Rx  . Order #: [id number] Class: Print  . Order #: [id number] Class: Normal  . Order #: [id number] Class: Normal  . Order #: [id number] Class: Print  . Order #: [id number] Class: Normal  . Order #: [id number] Class: Print    Allergies:  Patient has no known allergies.  Family History: No family history on file. No history of early cardiovascular disease Social History: Social History  Substance Use Topics  . Smoking status: Never Smoker  . Smokeless tobacco: Never Used  . Alcohol use 1.8 oz/week    3 Cans of beer per week  He denies any illicit drugs   Review of Systems:   10 point review of systems was performed and was otherwise negative:  Constitutional: No fever Eyes: No visual disturbances ENT: No sore throat, ear pain Cardiac: No chest pain Respiratory: No shortness of breath, wheezing, or stridor Abdomen: Patient points primarily to the epigastric area  radiating up toward both lower chest regions. Endocrine: No weight loss, No night sweats Extremities: No peripheral edema, cyanosis Skin: No rashes, easy bruising Neurologic: No focal weakness, trouble with speech or swollowing Urologic: No dysuria, Hematuria, or urinary frequency   Physical Exam:  ED Triage Vitals  Enc Vitals Group     BP 11/03/16 1706 126/84     Pulse Rate 11/03/16 1706 73     Resp 11/03/16 1706 18     Temp 11/03/16 1706 99 F (37.2 C)     Temp Source 11/03/16 1706 Oral     SpO2 11/03/16 1706 97 %     Weight 11/03/16 1707 167 lb (75.8 kg)     Height 11/03/16 1707 6' (1.829 m)     Head Circumference --      Peak Flow --      Pain Score 11/03/16 1707 8     Pain Loc --      Pain Edu? --      Excl. in GC? --     General: Awake , Alert , and Oriented times 3; GCS 15 Head: Normal cephalic , atraumatic Eyes: Pupils equal , round, reactive to light Nose/Throat: No nasal drainage, patent upper airway without erythema or exudate.  Neck: Supple, Full range of motion, No anterior adenopathy or palpable thyroid masses Lungs: Clear to ascultation without wheezes , rhonchi, or rales Heart: Regular rate, regular rhythm without murmurs , gallops , or rubs Abdomen: Soft, non tender without rebound, guarding ,  or rigidity; bowel sounds positive and symmetric in all 4 quadrants. No organomegaly .        Extremities: 2 plus symmetric pulses. No edema, clubbing or cyanosis Neurologic: normal ambulation, Motor symmetric without deficits, sensory intact Skin: warm, dry, no rashes   Labs:   All laboratory work was reviewed including any pertinent negatives or positives listed below:  Labs Reviewed  BASIC METABOLIC PANEL - Abnormal; Notable for the following:       Result Value   Glucose, Bld 126 (*)    All other components within normal limits  CBC - Abnormal; Notable for the following:    HCT 39.8 (*)    All other components within normal limits  HEPATIC FUNCTION PANEL  - Abnormal; Notable for the following:    Bilirubin, Direct <0.1 (*)    All other components within normal limits  TROPONIN I  FIBRIN DERIVATIVES D-DIMER ([facility] ONLY)  LIPASE, BLOOD   Laboratory work was reviewed and showed no clinically significant abnormalities.  EKG: ED ECG REPORT I, Jennye MoccasinBrian S Florinda Taflinger, the attending physician, personally viewed and interpreted this ECG.  Date: 11/03/2016 EKG Time:1704 Rate: 66 Rhythm: normal sinus rhythm QRS Axis: Rightward axis Intervals: normal ST/T Wave abnormalities: normal Conduction Disturbances: none Narrative Interpretation: unremarkable No acute ischemic changes   Radiology:  "Dg Chest 2 View  Result Date: 11/03/2016 CLINICAL DATA:  Midsternal chest pain radiating to LEFT beginning yesterday. Shortness of breath. EXAM: CHEST  2 VIEW COMPARISON:  Chest radiograph August 06, 2016. FINDINGS: The heart size and mediastinal contours are within normal limits. Both lungs are clear. The visualized skeletal structures are unremarkable. IMPRESSION: Normal chest. Electronically Signed   By: Awilda Metroourtnay  Bloomer M.D.   On: 11/03/2016 17:43  "  I personally reviewed the radiologic studies    ED Course: * Differential includes all life-threatening causes for chest pain. This includes but is not exclusive to acute coronary syndrome, aortic dissection, pulmonary embolism, cardiac tamponade, community-acquired pneumonia, pericarditis, musculoskeletal chest wall pain, etc. Given the patient's current clinical presentation and age, risk factors, etc. I felt this was unlikely to be a life-threatening cause for his chest pain. He has minimal pulmonary emboli risk factors and with a negative D-dimer test I felt this not require pursued. Chest x-ray shows a normal cardiac silhouette and given his age again is unlikely to be an aortic dissection, etc. Systolic more toward the epigastric area and then spread from there and both lower chest wall regions. This may  be esophageal reflux disease as patient states he's been taking ibuprofen which the pain slowly increased in intensity.     Assessment:  Acute unspecified chest pain  Final Clinical Impression: *  Final diagnoses:  Atypical chest pain     Plan:  Outpatient " New Prescriptions   PANTOPRAZOLE (PROTONIX) 20 MG TABLET    Take 1 tablet (20 mg total) by mouth daily.   TRAMADOL (ULTRAM) 50 MG TABLET    Take 1 tablet (50 mg total) by mouth every 6 (six) hours as needed.  " Patient was advised to return immediately if condition worsens. Patient was advised to follow up with their primary care physician or other specialized physicians involved in their outpatient care. The patient and/or family member/power of attorney had laboratory results reviewed at the bedside. All questions and concerns were addressed and appropriate discharge instructions were distributed by the nursing staff.            Jennye MoccasinBrian S Saulo Anthis, MD 11/03/16 [id number]

## 2016-11-03 NOTE — Discharge Instructions (Signed)
Please take Tylenol for pain and drink plenty of fluids. Return here to emergency department especially for persistent shortness of breath, fever, bloody stool, or any other new concerns. Please continue with her follow-up with your primary physician on Tuesday.  Please return immediately if condition worsens. Please contact her primary physician or the physician you were given for referral. If you have any specialist physicians involved in her treatment and plan please also contact them. Thank you for using [facility].

## 2017-01-13 ENCOUNTER — Ambulatory Visit
Admission: EM | Admit: 2017-01-13 | Discharge: 2017-01-13 | Disposition: A | Discharge status: Home / Self Care | Payer: Self-pay | Attending: Family Medicine | Admitting: Family Medicine

## 2017-01-13 ENCOUNTER — Ambulatory Visit (INDEPENDENT_AMBULATORY_CARE_PROVIDER_SITE_OTHER): Payer: Self-pay

## 2017-01-13 ENCOUNTER — Encounter: Payer: Self-pay | Admitting: Emergency Medicine

## 2017-01-13 ENCOUNTER — Ambulatory Visit: Payer: Self-pay

## 2017-01-13 DIAGNOSIS — S5002XA Contusion of left elbow, initial encounter: Secondary | ICD-10-CM

## 2017-01-13 DIAGNOSIS — R109 Unspecified abdominal pain: Secondary | ICD-10-CM

## 2017-01-13 DIAGNOSIS — R112 Nausea with vomiting, unspecified: Secondary | ICD-10-CM

## 2017-01-13 DIAGNOSIS — W19XXXA Unspecified fall, initial encounter: Secondary | ICD-10-CM

## 2017-01-13 DIAGNOSIS — R1110 Vomiting, unspecified: Secondary | ICD-10-CM

## 2017-01-13 DIAGNOSIS — M940 Chondrocostal junction syndrome [Tietze]: Secondary | ICD-10-CM

## 2017-01-13 DIAGNOSIS — R0781 Pleurodynia: Secondary | ICD-10-CM

## 2017-01-13 DIAGNOSIS — S20211A Contusion of right front wall of thorax, initial encounter: Secondary | ICD-10-CM

## 2017-01-13 LAB — CBC WITH DIFFERENTIAL/PLATELET
BASOS ABS: 0.1 10*3/uL (ref 0–0.1)
BASOS PCT: 1 %
EOS ABS: 0.1 10*3/uL (ref 0–0.7)
EOS PCT: 1 %
HCT: 43.9 % (ref 40.0–52.0)
HEMOGLOBIN: 14.6 g/dL (ref 13.0–18.0)
Lymphocytes Relative: 27 %
Lymphs Abs: 2.5 10*3/uL (ref 1.0–3.6)
MCH: 27.7 pg (ref 26.0–34.0)
MCHC: 33.3 g/dL (ref 32.0–36.0)
MCV: 83.4 fL (ref 80.0–100.0)
Monocytes Absolute: 0.8 10*3/uL (ref 0.2–1.0)
Monocytes Relative: 9 %
NEUTROS PCT: 62 %
Neutro Abs: 6 10*3/uL (ref 1.4–6.5)
PLATELETS: 252 10*3/uL (ref 150–440)
RBC: 5.26 MIL/uL (ref 4.40–5.90)
RDW: 13.9 % (ref 11.5–14.5)
WBC: 9.5 10*3/uL (ref 3.8–10.6)

## 2017-01-13 LAB — COMPREHENSIVE METABOLIC PANEL
ALBUMIN: 4.4 g/dL (ref 3.5–5.0)
ALK PHOS: 72 U/L (ref 38–126)
ALT: 35 U/L (ref 17–63)
ANION GAP: 6 (ref 5–15)
AST: 28 U/L (ref 15–41)
BUN: 15 mg/dL (ref 6–20)
CHLORIDE: 107 mmol/L (ref 101–111)
CO2: 25 mmol/L (ref 22–32)
Calcium: 9 mg/dL (ref 8.9–10.3)
Creatinine, Ser: 0.91 mg/dL (ref 0.61–1.24)
GFR calc non Af Amer: >60 mL/min (ref >60)
GLUCOSE: 104 mg/dL — AB (ref 65–99)
Potassium: 3.7 mmol/L (ref 3.5–5.1)
SODIUM: 138 mmol/L (ref 135–145)
Total Bilirubin: 0.4 mg/dL (ref 0.3–1.2)
Total Protein: 7.9 g/dL (ref 6.5–8.1)

## 2017-01-13 LAB — URINALYSIS, COMPLETE (UACMP) WITH MICROSCOPIC
BACTERIA UA: NONE SEEN
BILIRUBIN URINE: NEGATIVE
Glucose, UA: NEGATIVE mg/dL
Hgb urine dipstick: NEGATIVE
KETONES UR: NEGATIVE mg/dL
LEUKOCYTES UA: NEGATIVE
NITRITE: NEGATIVE
Protein, ur: NEGATIVE mg/dL
Specific Gravity, Urine: 1.025 (ref 1.005–1.030)
Squamous Epithelial / LPF: NONE SEEN
pH: 6 (ref 5.0–8.0)

## 2017-01-13 MED ORDER — ONDANSETRON 8 MG PO TBDP: 8.0000 mg | ORAL_TABLET | Freq: Three times a day (TID) | ORAL | 0 refills | Status: AC | PRN

## 2017-01-13 MED ORDER — HYDROCODONE-ACETAMINOPHEN 5-325 MG PO TABS: ORAL_TABLET | ORAL | 0 refills | Status: AC
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
## 2017-12-16 IMAGING — CR DG CHEST 2V
2 series · 2 of 2 positions shown · non-contrast
Comparison: None.

CLINICAL DATA: 25 y/o  M; chest pain on lower left-sided chest.

EXAM:
CHEST  2 VIEW

[chest pa]
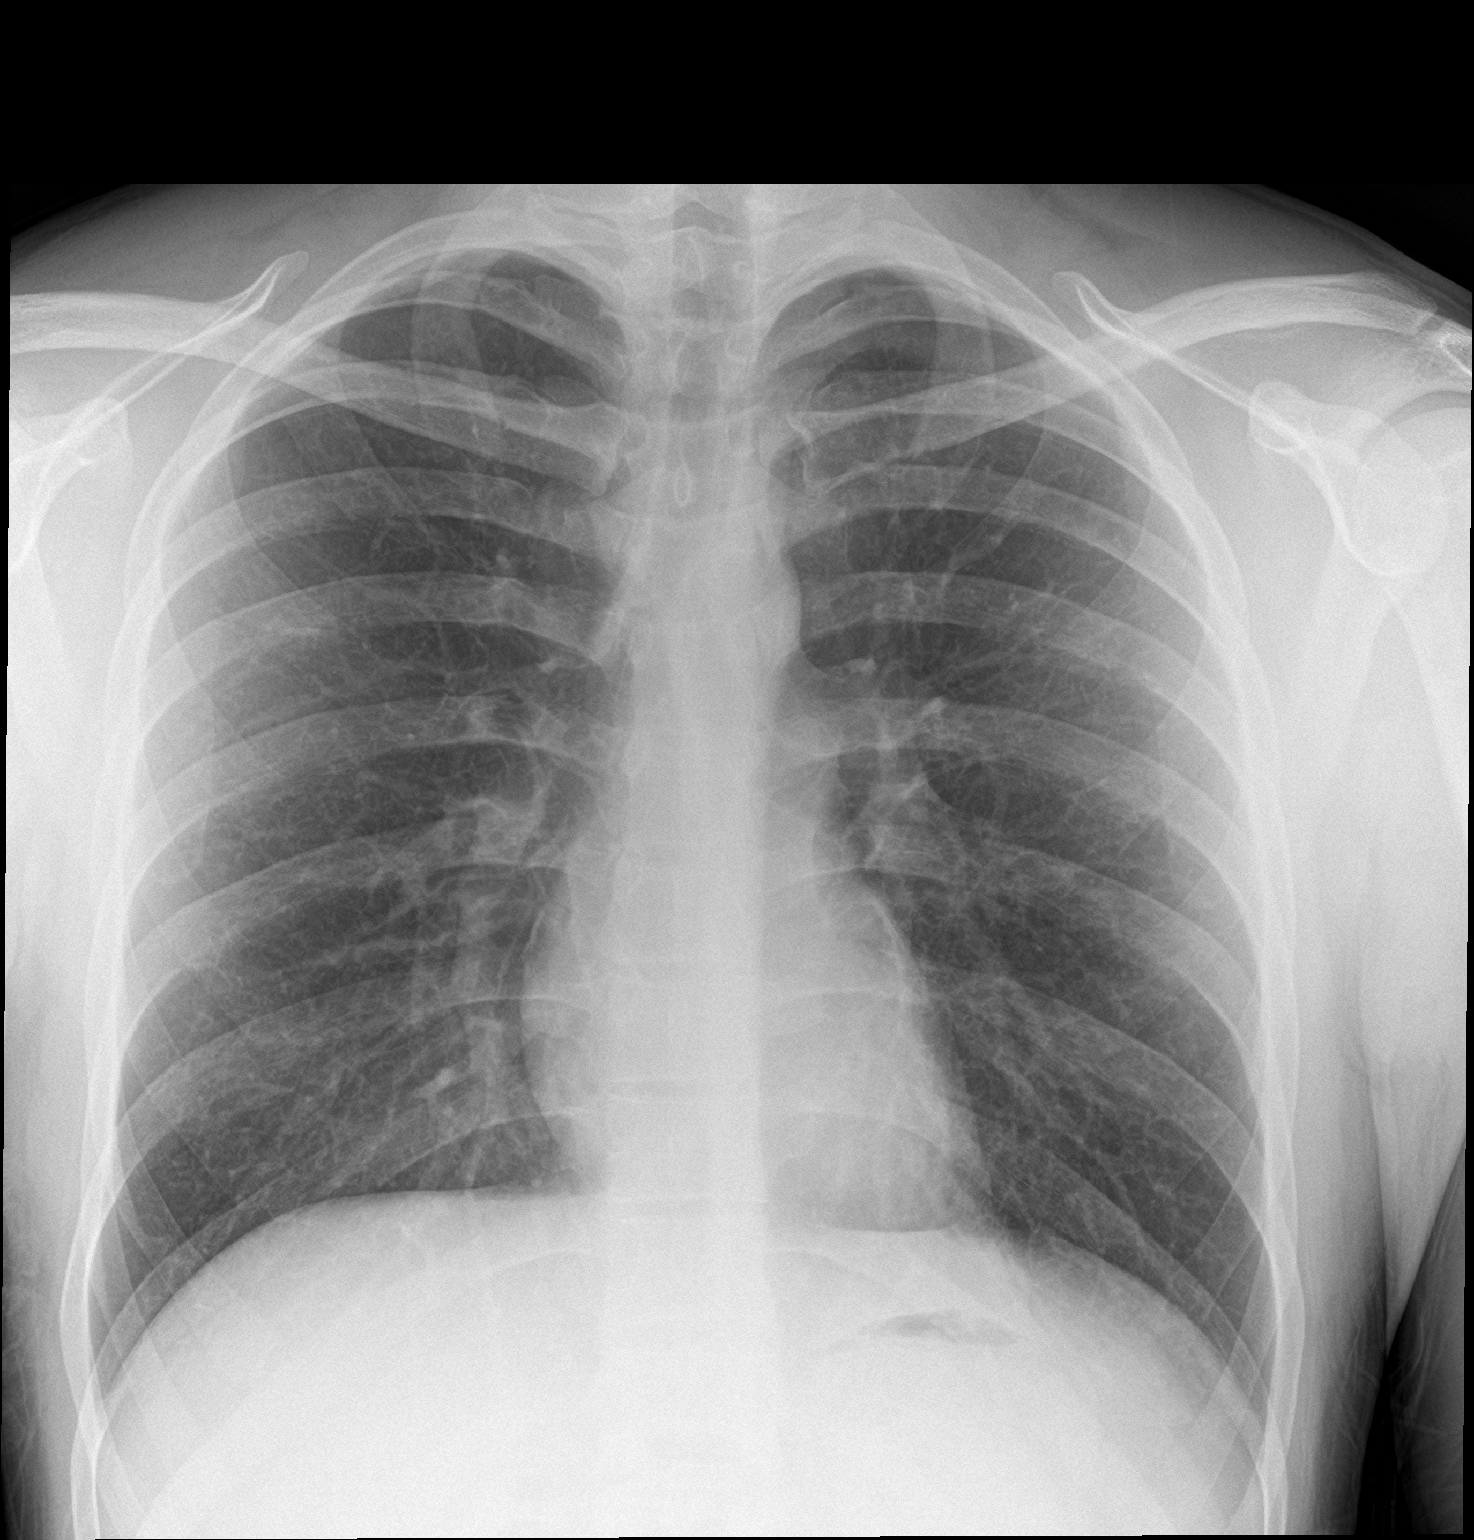

[chest lat]
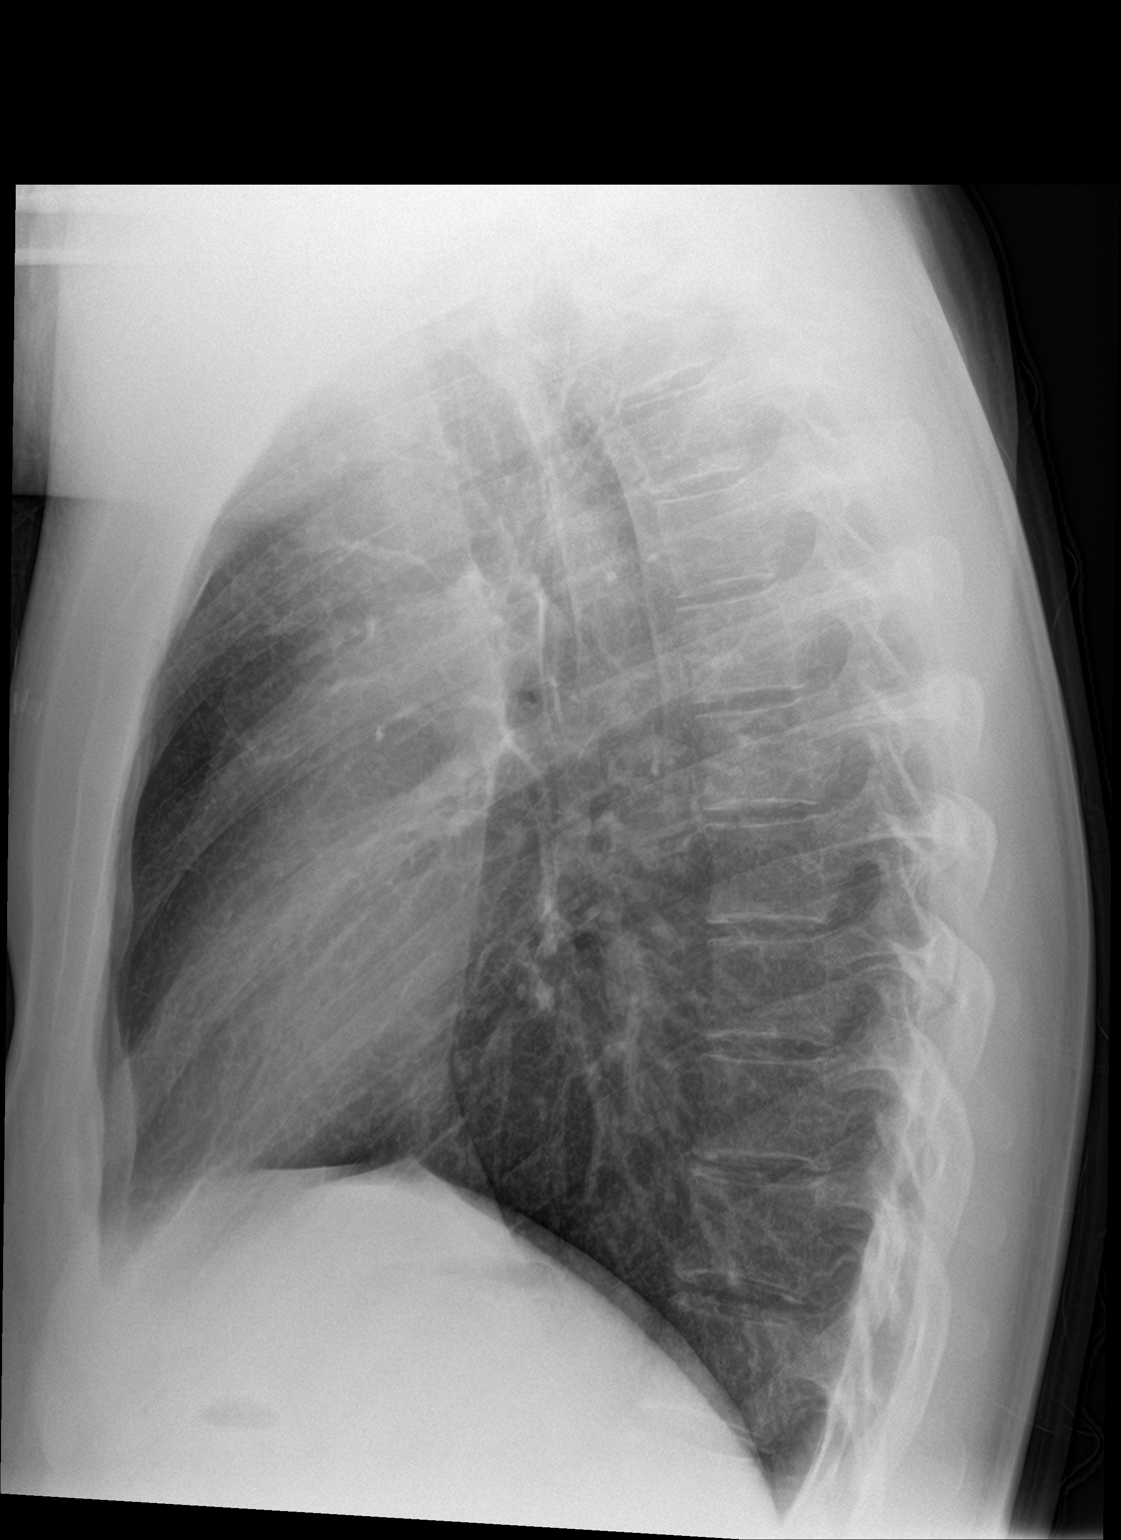

[2 of 2 positions shown; findings below may reference images not displayed]

FINDINGS: The heart size and mediastinal contours are within normal limits.
Both lungs are clear. The visualized skeletal structures are
unremarkable.
IMPRESSION: No active cardiopulmonary disease.

By: Marija Maja Fankhauser M.D.

## 2018-03-23 IMAGING — CR DG CHEST 2V
2 series · 2 of 2 positions shown · non-contrast
Comparison: Chest radiograph August 06, 2016.

CLINICAL DATA: Midsternal chest pain radiating to LEFT beginning
yesterday. Shortness of breath.

EXAM:
CHEST  2 VIEW

[chest pa]
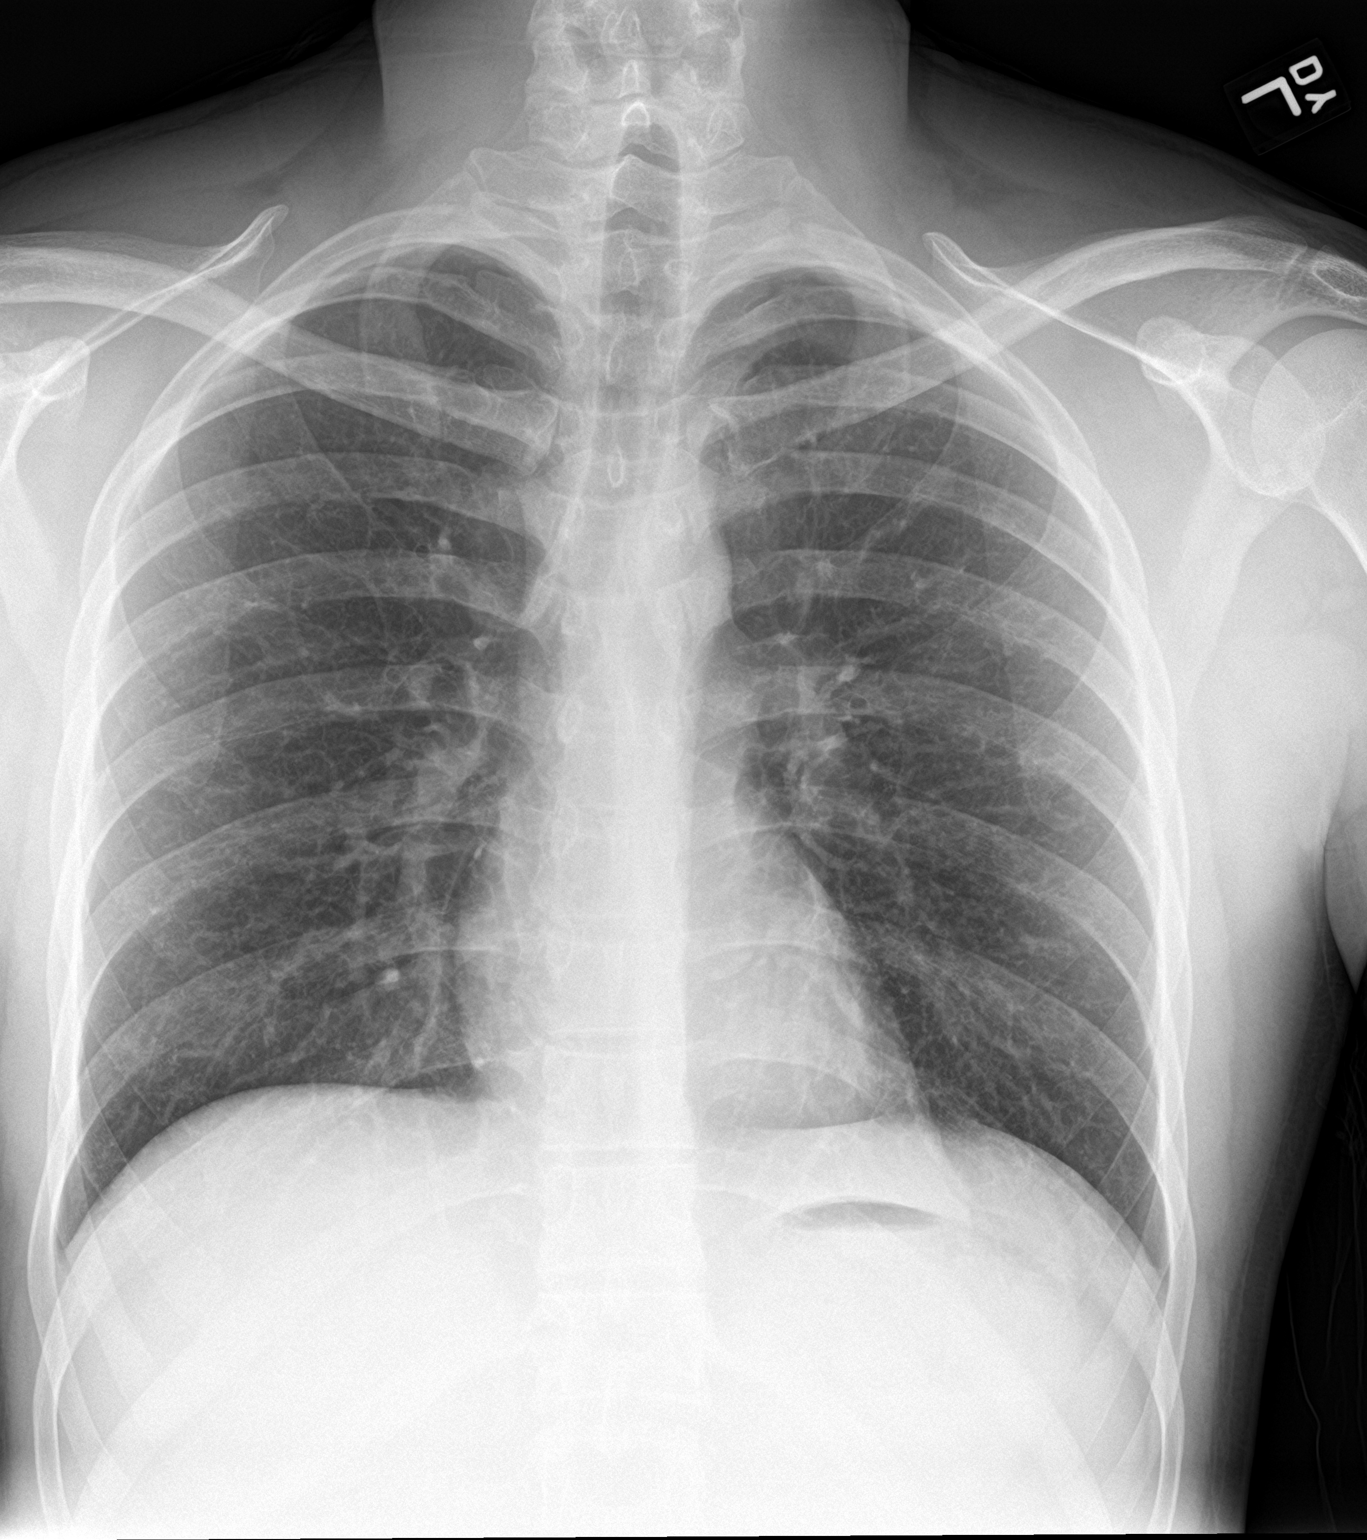

[chest lat]
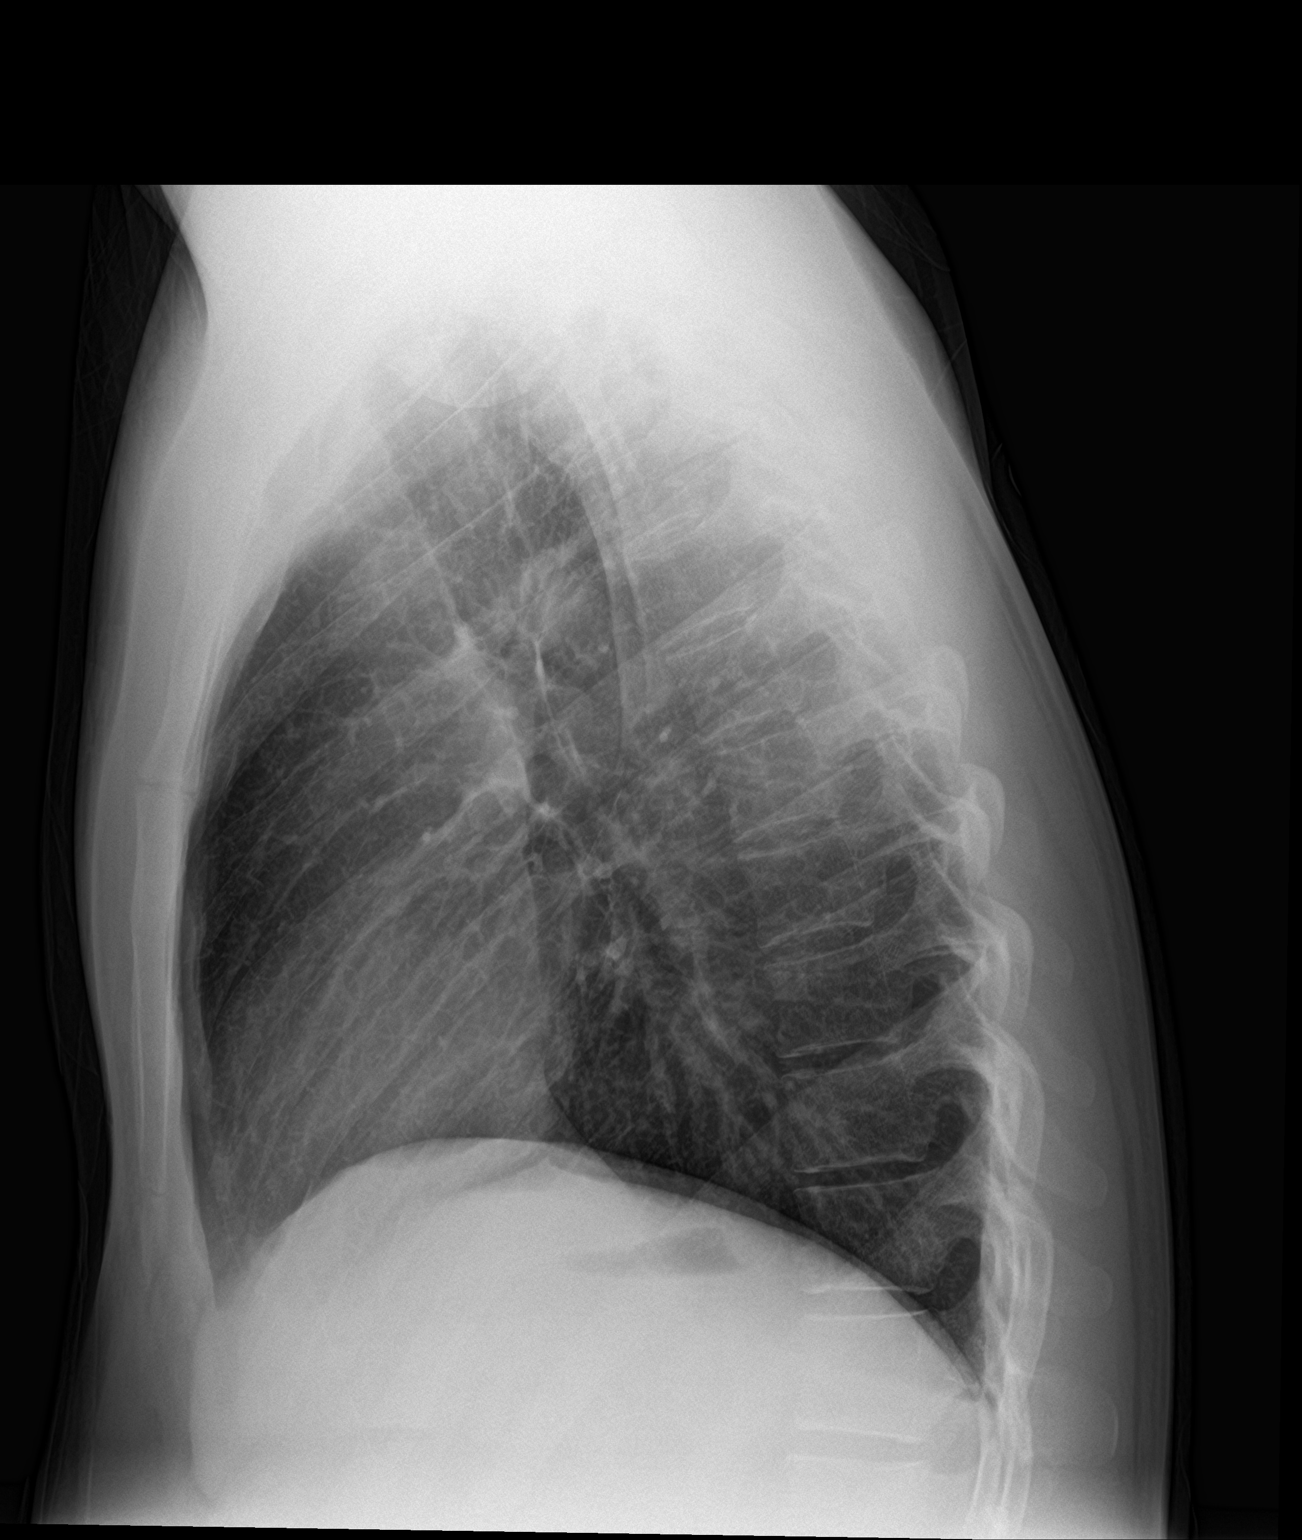

[2 of 2 positions shown; findings below may reference images not displayed]

FINDINGS: The heart size and mediastinal contours are within normal limits.
Both lungs are clear. The visualized skeletal structures are
unremarkable.
IMPRESSION: Normal chest.
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
